# Patient Record
Sex: Male | Born: 1985 | Race: White | Hispanic: No | Marital: Married | Smoking: Never smoker
Health system: Southern US, Community
[De-identification: ages and names within clinical notes are randomized; demographics above are authoritative.]

## PROBLEM LIST (undated history)

## (undated) HISTORY — PX: OTHER SURGICAL HISTORY: SHX169

## (undated) HISTORY — PX: SHOULDER SURGERY: SHX246

---

## 2013-04-27 ENCOUNTER — Ambulatory Visit (INDEPENDENT_AMBULATORY_CARE_PROVIDER_SITE_OTHER): Payer: PRIVATE HEALTH INSURANCE

## 2013-04-27 ENCOUNTER — Other Ambulatory Visit: Payer: Self-pay | Admitting: Adult Health

## 2013-04-27 DIAGNOSIS — N200 Calculus of kidney: Secondary | ICD-10-CM

## 2013-04-27 DIAGNOSIS — M542 Cervicalgia: Secondary | ICD-10-CM

## 2013-04-27 DIAGNOSIS — M25519 Pain in unspecified shoulder: Secondary | ICD-10-CM

## 2013-04-27 DIAGNOSIS — M8448XA Pathological fracture, other site, initial encounter for fracture: Secondary | ICD-10-CM

## 2013-12-15 ENCOUNTER — Encounter: Payer: Self-pay | Admitting: Family Medicine

## 2013-12-15 ENCOUNTER — Ambulatory Visit (INDEPENDENT_AMBULATORY_CARE_PROVIDER_SITE_OTHER): Payer: PRIVATE HEALTH INSURANCE | Admitting: Family Medicine

## 2013-12-15 VITALS — BP 137/85 | HR 83 | Temp 98.9°F | Ht 70.0 in | Wt 200.0 lb

## 2013-12-15 DIAGNOSIS — L0501 Pilonidal cyst with abscess: Secondary | ICD-10-CM

## 2013-12-15 DIAGNOSIS — N200 Calculus of kidney: Secondary | ICD-10-CM

## 2013-12-15 LAB — POCT URINALYSIS DIPSTICK
Bilirubin, UA: NEGATIVE
Glucose, UA: NEGATIVE
Ketones, UA: NEGATIVE
LEUKOCYTES UA: NEGATIVE
Nitrite, UA: NEGATIVE
PROTEIN UA: NEGATIVE
SPEC GRAV UA: 1.02
UROBILINOGEN UA: 0.2
pH, UA: 7

## 2013-12-15 MED ORDER — CLINDAMYCIN HCL 300 MG PO CAPS: 300.0000 mg | ORAL_CAPSULE | Freq: Three times a day (TID) | ORAL | Status: AC

## 2013-12-15 MED ORDER — TRAMADOL HCL 50 MG PO TABS: 50.0000 mg | ORAL_TABLET | Freq: Three times a day (TID) | ORAL | Status: AC | PRN

## 2013-12-15 NOTE — Patient Instructions (Signed)
Call me if not contacted about a general surgery visit for your abscess or a ultrasound of your kidneys within the next week.

## 2013-12-15 NOTE — Progress Notes (Signed)
CC: Douglas IslamMarty Austin is a 28 y.o. male is here for Establish Care and Nephrolithiasis   Subjective: HPI:  Pleasant 28 year old here to establish care  Patient complains of multiple episodes of nephrolithiasis attacks. Most recently occurred 4 months ago and passed within a day. Over the weekend he developed right flank pain described only as sharp and severe it eventually radiated into the right lower quadrant. He had urinary urgency and actually was unable to urinate for 12 hours on Sunday. He increased his fluids and did not do any other interventions and finally passed a approximately 5 mm stone on Monday and had complete resolution of all pain except for some soreness on his right flank that persists. Nothing particularly makes symptoms better or worse. The only imaging is ever had revealed 7 mm stone in the pole of his kidney in February of this year. He's had grossly bloody urine in the past with past episodes but no blood in the past 7 days. He denies any urinary urgency hesitancy nor frequency nor burning currently.  Patient complains of 3 separate surgeries for a pilonidal cyst stemming back until 2007. It sounds like the majority of these surgeries have been complicated by poor wound healing. He currently complains of pain at the top of his gluteal cleft it is moderate in severity And worse with sitting or leaning back on his back. No interventions as of yet since the pain began a few weeks ago. It seems to be getting worse on a weekly basis and has been draining clear and yellow discharge.  Review of Systems - General ROS: negative for - chills, fever, night sweats, weight gain or weight loss Ophthalmic ROS: negative for - decreased vision Psychological ROS: negative for - anxiety or depression ENT ROS: negative for - hearing change, nasal congestion, tinnitus or allergies Hematological and Lymphatic ROS: negative for - bleeding problems, bruising or swollen lymph nodes Breast ROS:  negative Respiratory ROS: no cough, shortness of breath, or wheezing Cardiovascular ROS: no chest pain or dyspnea on exertion Gastrointestinal ROS: no abdominal pain, change in bowel habits, or black or bloody stools other than that described above Genito-Urinary ROS: negative for - genital discharge, genital ulcers, incontinence or abnormal bleeding from genitals Musculoskeletal ROS: negative for - joint pain or muscle pain Neurological ROS: negative for - headaches or memory loss Dermatological ROS: negative for lumps, mole changes, rash and skin lesion changes other than that described above  History reviewed. No pertinent past medical history.  Past Surgical History  Procedure Laterality Date  . Pilonidal cyst removed  x 3   . Shoulder surgery     Family History  Problem Relation Age of Onset  . Cancer      parent   . Diabetes      grandparent  . Hyperlipidemia      grandparent  . Hypertension      parent    History   Social History  . Marital Status: Married    Spouse Name: N/A    Number of Children: N/A  . Years of Education: N/A   Occupational History  . Not on file.   Social History Main Topics  . Smoking status: Never Smoker   . Smokeless tobacco: Not on file  . Alcohol Use: No  . Drug Use: No  . Sexual Activity: Yes    Partners: Female   Other Topics Concern  . Not on file   Social History Narrative  . No narrative on file  Objective: BP 137/85  Pulse 83  Temp(Src) 98.9 F (37.2 C) (Oral)  Ht 5\' 10"  (1.778 m)  Wt 200 lb (90.719 kg)  BMI 28.70 kg/m2  General: Alert and Oriented, No Acute Distress HEENT: Pupils equal, round, reactive to light. Conjunctivae clear.   moist membranes pharynx unremarkable  Lungs:  clear comfortable work of breathing  Cardiac: Regular rate and rhythm.  Abdomen:  mild obesity  Extremities: No peripheral edema.  Strong peripheral pulses.  Mental Status: No depression, anxiety, nor agitation. Skin: Warm and dry.  At the top of his gluteal cleft he has a 3 cm diameter patch of mild erythema overlying significant scarring with a central pore at the superiormost portion which is draining straw-colored fluid. About 3 cm below this he has another 2 cm patch of scar tissue and erythema with another pore draining clear colored fluid. Moderately tender with fluctuance.  Assessment & Plan: Sharl MaMarty was seen today for establish care and nephrolithiasis.  Diagnoses and associated orders for this visit:  Kidney stone - Urinalysis Dipstick  Nephrolithiasis - US Renal; Future  Pilonidal abscess - traMADol (ULTRAM) 50 MG tablet; Take 1 tablet (50 mg total) by mouth every 8 (eight) hours as needed. - clindamycin (CLEOCIN) 300 MG capsule; Take 1 capsule (300 mg total) by mouth 3 (three) times daily. - Ambulatory referral to General Surgery    Nephrolithiasis: Due to remaining discomfort and to evaluate he still has any stones left in his kidney checking a renal ultrasound, hydronephrosis. I do not believe he has any obstructing stone based on his pain being only minimal and without any remaining urinary symptoms. He was given a strainer for his urine should the symptoms return in hopes of collecting a stone for analysis. Pilonidal abscess: Clindamycin to help with infectious component, tramadol for pain and ibuprofen 800 mg 3 times a day does not help. General surgery referral for definitive management  Signs and symptoms requring emergent/urgent reevaluation were discussed with the patient.   Return if symptoms worsen or fail to improve.

## 2013-12-20 ENCOUNTER — Ambulatory Visit (INDEPENDENT_AMBULATORY_CARE_PROVIDER_SITE_OTHER): Payer: PRIVATE HEALTH INSURANCE

## 2013-12-20 DIAGNOSIS — N200 Calculus of kidney: Secondary | ICD-10-CM

## 2015-02-27 IMAGING — US US RENAL
1 series · 14 of 25 positions shown · non-contrast
Comparison: 04/27/2013

CLINICAL DATA: Nephrolithiasis, right flank pain

EXAM:
RENAL/URINARY TRACT ULTRASOUND COMPLETE

[Series 1: us renal · 0.18mm/px · 14 of 36 slices shown]
[im 1/36]
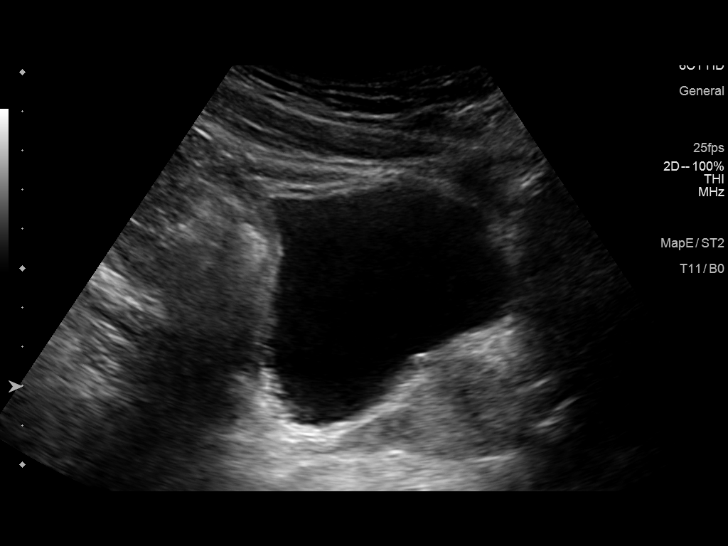
[im 3/36]
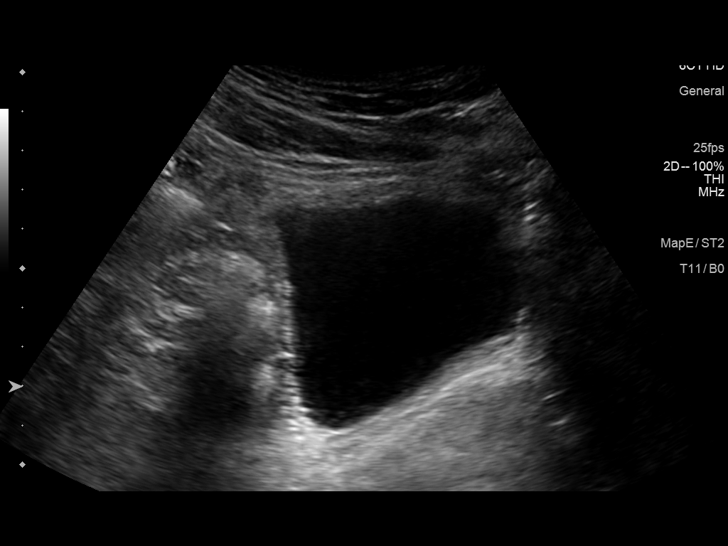
[im 6/36]
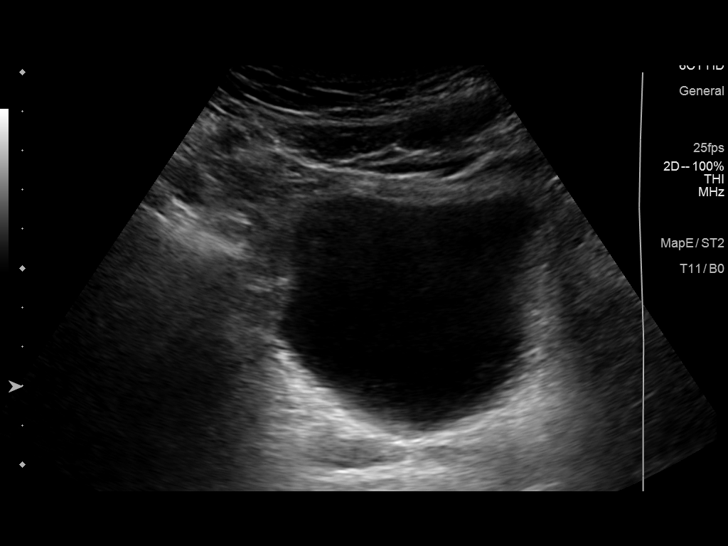
[im 9/36]
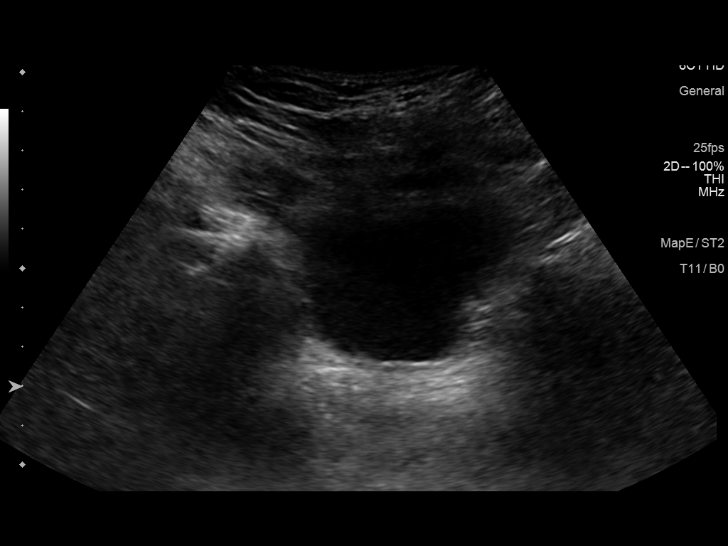
[im 12/36]
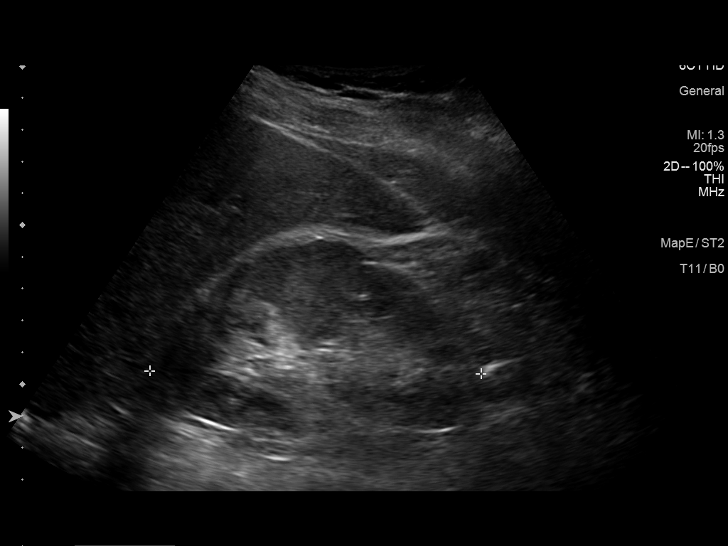
[im 14/36]
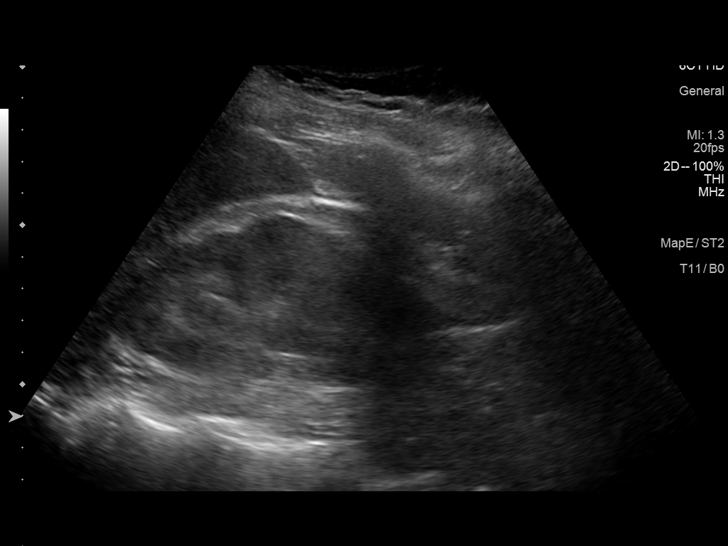
[im 17/36]
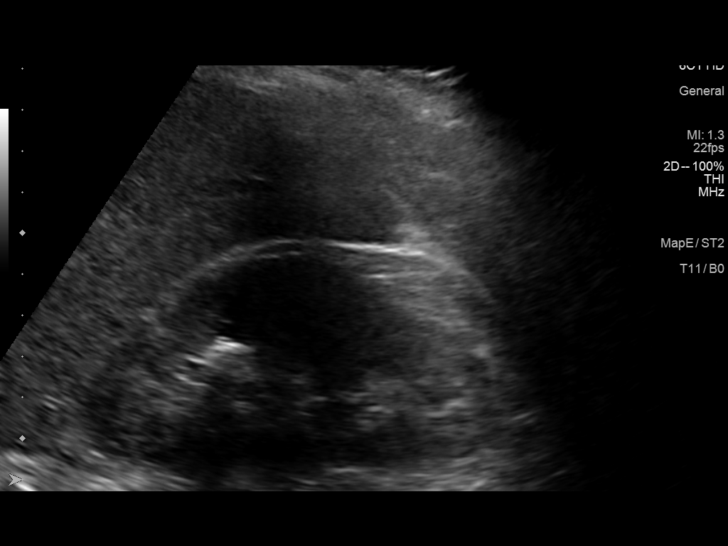
[im 19/36]
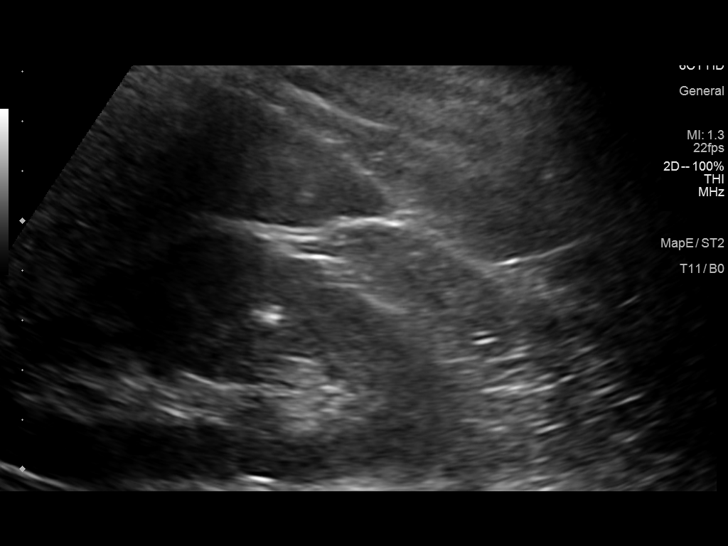
[im 22/36]
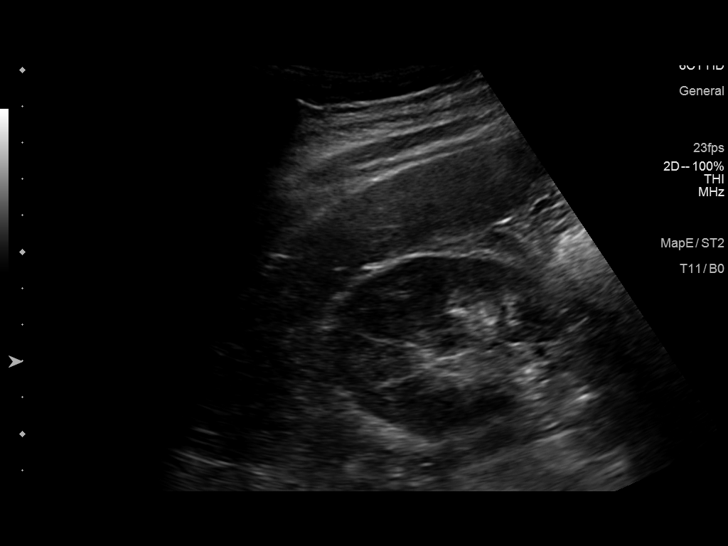
[im 24/36]
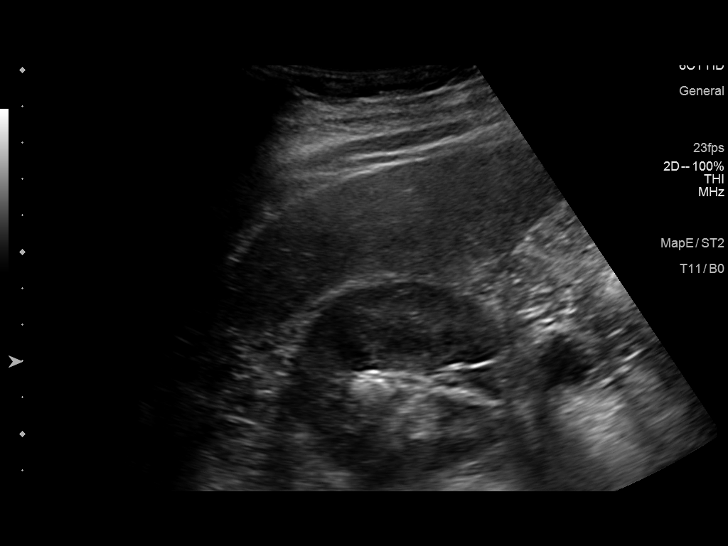
[im 27/36]
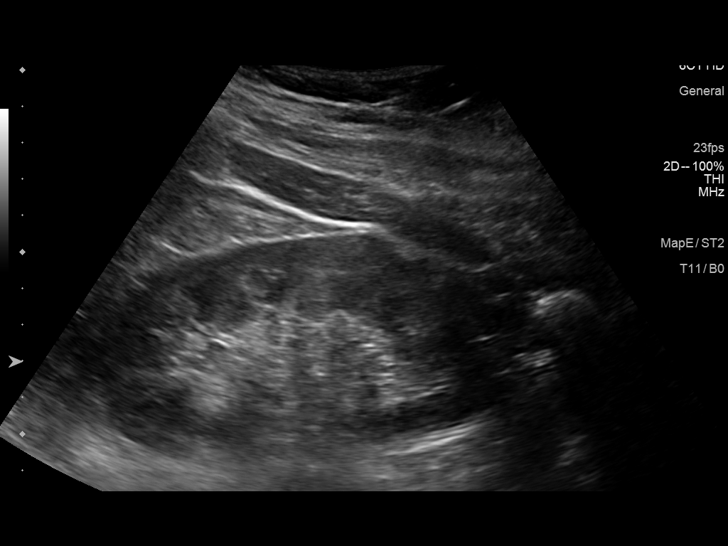
[im 30/36]
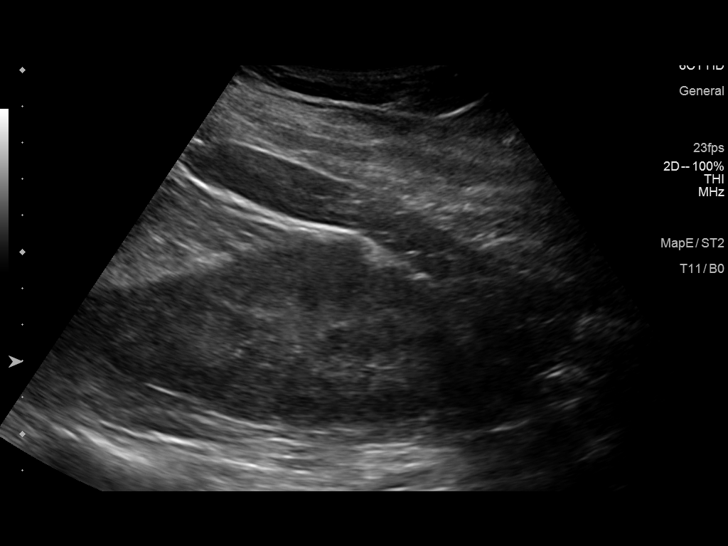
[im 33/36]
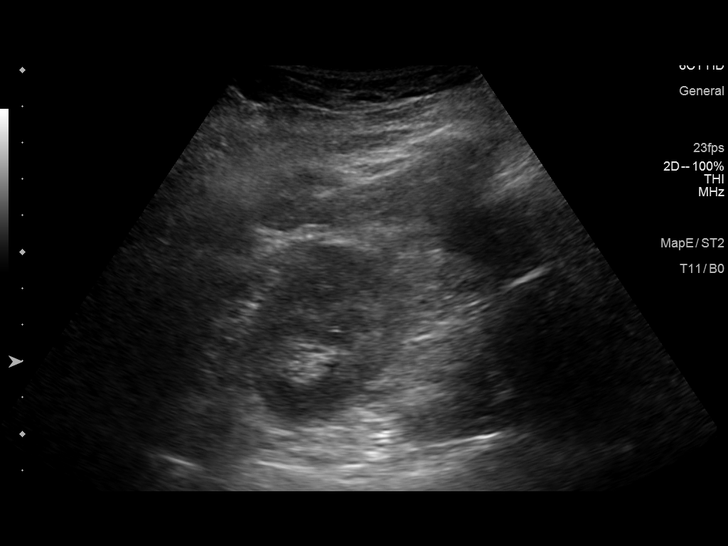
[im 36/36]
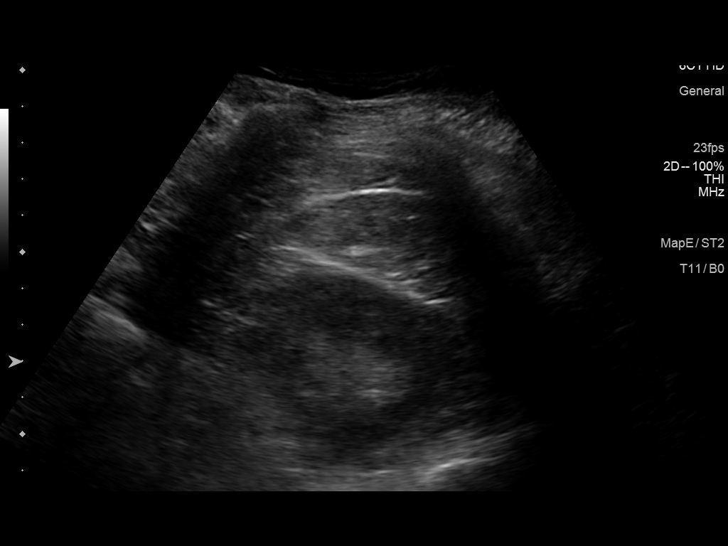

[14 of 25 positions shown; findings below may reference images not displayed]

FINDINGS: Right Kidney:

Length: 10.4 cm. Normal echogenicity. No hydronephrosis. Echogenic
shadowing focus in the upper pole measures 8 mm compatible with
nephrolithiasis. Echogenic focus in the lower pole also measures 8
mm suspicious for additional nephrolithiasis.

Left Kidney:

Length: 11.9 cm. Echogenicity within normal limits. No mass or
hydronephrosis visualized.

Bladder:

Appears normal for degree of bladder distention.
IMPRESSION: Right nephrolithiasis without hydronephrosis.
